# Patient Record
Sex: Male | Born: 1955 | Race: Black or African American | Hispanic: No | Marital: Married | Smoking: Never smoker
Health system: Southern US, Community
[De-identification: ages and names within clinical notes are randomized; demographics above are authoritative.]

## PROBLEM LIST (undated history)

## (undated) DIAGNOSIS — I10 Essential (primary) hypertension: Secondary | ICD-10-CM

## (undated) DIAGNOSIS — E119 Type 2 diabetes mellitus without complications: Secondary | ICD-10-CM

---

## 2000-02-14 ENCOUNTER — Emergency Department (HOSPITAL_COMMUNITY): Admission: EM | Admit: 2000-02-14 | Discharge: 2000-02-14 | Payer: Self-pay | Admitting: Emergency Medicine

## 2000-04-03 ENCOUNTER — Encounter: Admission: RE | Admit: 2000-04-03 | Discharge: 2000-04-03 | Payer: Self-pay | Admitting: Family Medicine

## 2000-04-03 ENCOUNTER — Encounter: Payer: Self-pay | Admitting: Family Medicine

## 2004-09-05 ENCOUNTER — Emergency Department (HOSPITAL_COMMUNITY): Admission: EM | Admit: 2004-09-05 | Discharge: 2004-09-05 | Payer: Self-pay | Admitting: Emergency Medicine

## 2004-09-16 ENCOUNTER — Ambulatory Visit (HOSPITAL_COMMUNITY): Admission: RE | Admit: 2004-09-16 | Discharge: 2004-09-16 | Payer: Self-pay | Admitting: Family Medicine

## 2008-03-27 ENCOUNTER — Emergency Department (HOSPITAL_COMMUNITY): Admission: EM | Admit: 2008-03-27 | Discharge: 2008-03-27 | Payer: Self-pay | Admitting: Emergency Medicine

## 2010-08-30 ENCOUNTER — Emergency Department (HOSPITAL_BASED_OUTPATIENT_CLINIC_OR_DEPARTMENT_OTHER)
Admission: EM | Admit: 2010-08-30 | Discharge: 2010-08-31 | Disposition: A | Discharge status: Nursing Home (Includes ICF) | Payer: BLUE CROSS/BLUE SHIELD | Source: Home / Self Care | Attending: Emergency Medicine | Admitting: Emergency Medicine

## 2010-08-30 DIAGNOSIS — X58XXXA Exposure to other specified factors, initial encounter: Secondary | ICD-10-CM | POA: Insufficient documentation

## 2010-08-30 DIAGNOSIS — I10 Essential (primary) hypertension: Secondary | ICD-10-CM | POA: Insufficient documentation

## 2010-08-30 DIAGNOSIS — T783XXA Angioneurotic edema, initial encounter: Secondary | ICD-10-CM | POA: Insufficient documentation

## 2010-08-30 DIAGNOSIS — R4701 Aphasia: Secondary | ICD-10-CM | POA: Insufficient documentation

## 2010-08-30 DIAGNOSIS — E1169 Type 2 diabetes mellitus with other specified complication: Secondary | ICD-10-CM | POA: Insufficient documentation

## 2010-08-30 DIAGNOSIS — N289 Disorder of kidney and ureter, unspecified: Secondary | ICD-10-CM | POA: Insufficient documentation

## 2010-08-30 DIAGNOSIS — R220 Localized swelling, mass and lump, head: Secondary | ICD-10-CM | POA: Insufficient documentation

## 2010-08-30 LAB — CBC
HCT: 37.7 % — ABNORMAL LOW (ref 39.0–52.0)
Hemoglobin: 12.6 g/dL — ABNORMAL LOW (ref 13.0–17.0)
MCH: 24.9 pg — ABNORMAL LOW (ref 26.0–34.0)
MCHC: 33.4 g/dL (ref 30.0–36.0)
MCV: 74.5 fL — ABNORMAL LOW (ref 78.0–100.0)
Platelets: 283 10*3/uL (ref 150–400)
RBC: 5.06 MIL/uL (ref 4.22–5.81)
RDW: 15.1 % (ref 11.5–15.5)
WBC: 7.7 10*3/uL (ref 4.0–10.5)

## 2010-08-30 LAB — BASIC METABOLIC PANEL
BUN: 25 mg/dL — ABNORMAL HIGH (ref 6–23)
CO2: 26 mEq/L (ref 19–32)
Calcium: 9.4 mg/dL (ref 8.4–10.5)
Chloride: 99 mEq/L (ref 96–112)
Creatinine, Ser: 1.8 mg/dL — ABNORMAL HIGH (ref 0.4–1.5)
GFR calc Af Amer: 48 mL/min — ABNORMAL LOW (ref >60)
GFR calc non Af Amer: 40 mL/min — ABNORMAL LOW (ref >60)
Glucose, Bld: 173 mg/dL — ABNORMAL HIGH (ref 70–99)
Potassium: 4.3 mEq/L (ref 3.5–5.1)
Sodium: 139 mEq/L (ref 135–145)

## 2010-08-31 ENCOUNTER — Inpatient Hospital Stay (HOSPITAL_COMMUNITY)
Admission: EM | Admit: 2010-08-31 | Discharge: 2010-09-02 | DRG: 447 | Disposition: A | Discharge status: Home / Self Care | Payer: BLUE CROSS/BLUE SHIELD | Source: Other Acute Inpatient Hospital | Attending: Internal Medicine | Admitting: Internal Medicine

## 2010-08-31 DIAGNOSIS — N19 Unspecified kidney failure: Secondary | ICD-10-CM | POA: Diagnosis present

## 2010-08-31 DIAGNOSIS — T44905A Adverse effect of unspecified drugs primarily affecting the autonomic nervous system, initial encounter: Secondary | ICD-10-CM | POA: Diagnosis present

## 2010-08-31 DIAGNOSIS — T783XXA Angioneurotic edema, initial encounter: Principal | ICD-10-CM | POA: Diagnosis present

## 2010-08-31 DIAGNOSIS — E119 Type 2 diabetes mellitus without complications: Secondary | ICD-10-CM | POA: Diagnosis present

## 2010-08-31 DIAGNOSIS — I129 Hypertensive chronic kidney disease with stage 1 through stage 4 chronic kidney disease, or unspecified chronic kidney disease: Secondary | ICD-10-CM | POA: Diagnosis present

## 2010-08-31 DIAGNOSIS — Z794 Long term (current) use of insulin: Secondary | ICD-10-CM

## 2010-08-31 DIAGNOSIS — N189 Chronic kidney disease, unspecified: Secondary | ICD-10-CM | POA: Diagnosis present

## 2010-08-31 DIAGNOSIS — X58XXXA Exposure to other specified factors, initial encounter: Secondary | ICD-10-CM | POA: Diagnosis present

## 2010-08-31 LAB — CBC
HCT: 37.9 % — ABNORMAL LOW (ref 39.0–52.0)
Hemoglobin: 12.3 g/dL — ABNORMAL LOW (ref 13.0–17.0)
MCV: 76.4 fL — ABNORMAL LOW (ref 78.0–100.0)
RBC: 4.96 MIL/uL (ref 4.22–5.81)
WBC: 8.9 10*3/uL (ref 4.0–10.5)

## 2010-08-31 LAB — COMPREHENSIVE METABOLIC PANEL
ALT: 24 U/L (ref 0–53)
Alkaline Phosphatase: 63 U/L (ref 39–117)
BUN: 22 mg/dL (ref 6–23)
CO2: 23 mEq/L (ref 19–32)
Chloride: 103 mEq/L (ref 96–112)
Glucose, Bld: 294 mg/dL — ABNORMAL HIGH (ref 70–99)
Potassium: 4.5 mEq/L (ref 3.5–5.1)
Sodium: 136 mEq/L (ref 135–145)
Total Bilirubin: 1 mg/dL (ref 0.3–1.2)
Total Protein: 7.4 g/dL (ref 6.0–8.3)

## 2010-08-31 LAB — MAGNESIUM: Magnesium: 1.9 mg/dL (ref 1.5–2.5)

## 2010-08-31 LAB — GLUCOSE, CAPILLARY
Glucose-Capillary: 311 mg/dL — ABNORMAL HIGH (ref 70–99)
Glucose-Capillary: 280 mg/dL — ABNORMAL HIGH (ref 70–99)
Glucose-Capillary: 272 mg/dL — ABNORMAL HIGH (ref 70–99)
Glucose-Capillary: 272 mg/dL — ABNORMAL HIGH (ref 70–99)

## 2010-09-01 LAB — GLUCOSE, CAPILLARY
Glucose-Capillary: 247 mg/dL — ABNORMAL HIGH (ref 70–99)
Glucose-Capillary: 253 mg/dL — ABNORMAL HIGH (ref 70–99)
Glucose-Capillary: 339 mg/dL — ABNORMAL HIGH (ref 70–99)
Glucose-Capillary: 346 mg/dL — ABNORMAL HIGH (ref 70–99)

## 2010-09-02 LAB — COMPREHENSIVE METABOLIC PANEL
Albumin: 3.5 g/dL (ref 3.5–5.2)
BUN: 40 mg/dL — ABNORMAL HIGH (ref 6–23)
Calcium: 9 mg/dL (ref 8.4–10.5)
Glucose, Bld: 183 mg/dL — ABNORMAL HIGH (ref 70–99)
Sodium: 135 mEq/L (ref 135–145)
Total Protein: 7.1 g/dL (ref 6.0–8.3)

## 2010-09-02 LAB — GLUCOSE, CAPILLARY
Glucose-Capillary: 174 mg/dL — ABNORMAL HIGH (ref 70–99)
Glucose-Capillary: 183 mg/dL — ABNORMAL HIGH (ref 70–99)

## 2010-09-04 NOTE — Discharge Summary (Signed)
[name], [name] NO.:  [id number]  MEDICAL RECORD NO.:  [id number]           PATIENT TYPE:  I  LOCATION:  5526                         FACILITY:  [facility]  PHYSICIAN:  Pincus Large, MD     DATE OF BIRTH:  1955-09-20  DATE OF ADMISSION:  08/31/2010 DATE OF DISCHARGE:  09/02/2010                              DISCHARGE SUMMARY   PRIMARY CARE PHYSICIAN:  Lawrence Aschoff, MD.,  [facility]ician.  REASON FOR ADMISSION:  Difficulty breathing.  DISCHARGE DIAGNOSES: 1. Angioedema secondary to ACE inhibitor. 2. Hypertension. 3. Diabetes type 2. 4. History of gout. 5. Morbid obesity.  MEDICATION AT DISCHARGE:  Demerol 1 mg daily, Medrol Pak, Actos 30 mg daily, labetalol 200 mg p.o. b.i.d., hydrochlorothiazide 25 mg daily, insulin 70/30 10 units b.i.d.  BRIEF HOSPITAL COURSE:  A 55 year old male with known history of diabetes type 2, hypertension, gout, suddenly became short of breath, difficulty breathing, and he felt that throat is being constricted, had been around for, progressively got worse by March 6, went to the ER, was found to have uvula swollen, and swelling of his palate.  The patient was given Solu-Medrol along with some Benadryl and Pepcid. Subsequently, the patient's symptoms improved and he had some mild swelling and his tightness was decreased significant per the patient. The patient was admitted for angioedema.  No images was done.  CONSULT:  None.  LABS:  When he came to the hospital, his WBC was 7.7, hemoglobin was 12.6, platelet was 263.  His creatinine is 1.8.  MRSA by PCR was negative.  The patient was admitted.  He received IV Solu-Medrol which tapered down to prednisone.  We had difficulty controlling his blood sugar, so we had to increase his insulin at night with Lantus.  VITALS ON DISCHARGE:  Temperature is 97.2, pulse is 93, respirations 18, blood pressure is 129/69.  HOSPITAL COURSE: 1. Angioedema secondary to ACE.  ACE was  placed and was patient had     allergy.  The patient was given steroid, so I am taper down his     steroids, send him with tapering dose of steroid. 2. Hypertension.  Since the patient is not going to be on any ACE     inhibitor anymore, I started the patient on 200 mg labetalol     b.i.d., blood pressure is slightly controlled.  The patient was     advised to do ambulatory blood pressure monitor and show it to his     primary care next time he sees his primary care, Dr. Miguel Mooney. 3. Diabetes type 2.  The patient's blood sugar was slightly high     secondary to steroid.  He takes Actos and metformin.  Metformin is     contraindicated in this patient secondary to his renal failure, so     I will hold the metformin for now.  I am going to him Amaryl 1 mg     for now for further management.  Amaryl to be switched to glipizide     according to his primary care. 4. Chronic kidney disease.  According  to the patient, his baseline     creatinine is 1.7.  Today lab shows his creatinine is 2.  He does     not have any symptoms.  He wants to go home, so recommended repeat     this CMET in 1 week and the next time he sees his primary care. 5. Morbid obesity.  Weight loss is recommended in this patient for     better control of his diabetes and his blood sugar.  DISPOSITION:  Home.  DIET:  1800 ADA diet.          ______________________________ Pincus Large, MD     SA/MEDQ  D:  09/02/2010  T:  09/03/2010  Job:  [id number]  Electronically Signed by Pincus Large MD on 09/04/2010 03:53:43 PM

## 2010-09-17 NOTE — H&P (Signed)
NAMEJAYLEND, Mooney NO.:  [id number]  MEDICAL RECORD NO.:  [id number]           PATIENT TYPE:  I  LOCATION:  2111                         FACILITY:  [facility]  PHYSICIAN:  [name], MDDATE OF BIRTH:  12-04-1955  DATE OF ADMISSION:  08/31/2010 DATE OF DISCHARGE:                             HISTORY & PHYSICAL   PRIMARY CARE PHYSICIAN:  C. Duane Lope, MD  CHIEF COMPLAINT:  Difficulty breathing.  HISTORY OF PRESENT ILLNESS:  A 55 year old male with known history of diabetes mellitus type 2, hypertension, and gout suddenly had shortness of breath, difficulty breathing and feeling of his throat being constricted.  It happened around 4, progressively got worsened by 6, and he went to the ER where he was found to have uvula swollen and also swelling of his palate.  The patient was given Solu-Medrol along with some Benadryl and Pepcid.  Subsequently, his symptoms have improved at this time.  He still has mild swelling but his tightness in his neck has decreased significantly as per the patient.  He is able to breathe normally.  Denies any skin rash or itching.  Denies any skin breakout. Denies any cough or phlegm.  Denies any chest pain, nausea, vomiting, abdominal pain, dysuria, discharge, or diarrhea.  The patient denies having similar symptoms previously.  The patient stated he has been using a new medication over-the-counter which is called HCG which is a homeopathic medication used for weight loss and also the patient is on lisinopril for many years now, almost 3 years.  Dose was just increased last year.  At this time, the patient is admitted for further observation for possible angioedema and also at the same time, we will empirically treat him with antibiotics.  PAST MEDICAL HISTORY: 1. Hypertension. 2. Diabetes mellitus type 2. 3. Renal failure. 4. Gout.  PAST SURGICAL HISTORY:  Appendectomy.  MEDICATIONS PRIOR TO ADMISSION:  Lisinopril/HCTZ  20/12.5 p.o. daily, allopurinol, Actos with metformin, furosemide, and a recently started using over-the-counter HCG.  ALLERGIES:  The patient has known drug allergy for PENICILLIN which causes him tachycardic.  FAMILY HISTORY:  Negative for any angioedema and is positive for coronary artery disease and CHF.  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  Denies smoking cigarettes, drinking alcohol, use illegal drugs.  REVIEW OF SYSTEMS:  As per history of present illness nothing else significant.  PHYSICAL EXAMINATION:  GENERAL:  The patient examined at bedside not in acute distress. VITAL SIGNS:  Blood pressure is 208/95, pulse is 102 per minute, temperature 99.4, respirations 20 per minute, and O2 saturation is 97%. HEENT:  It is very difficult to completely see his soft palate and his uvula.  It does look edematous but not erythematous.  Otherwise, his tongue is looking normal and is in midline.  There is no tenderness in the neck or there is no swelling in the lips.  There is no other skin break in the face.  There is no facial asymmetry.  PERRLA positive, anicteric.  No pallor.  No discharge from ears, eyes,  or mouth. CHEST:  Bilateral air entry present.  No rhonchi.  No crepitation.  I did not hear ant stridor in the throat. HEART:  S1 and S2 heard. ABDOMEN:  Soft and nontender.  Bowel sounds heard. CNS:  Alert, awake, and oriented to time, place, and person.  Moves upper and lower extremities 5/5. EXTREMITIES:  Peripheral pulses felt, nonpitting edema in the lower extremities.  LABORATORY DATA:  CBC; WBC 7.7, hemoglobin is 12.6, hematocrit 37.7, and platelets 283.  Basic metabolic panel; sodium 139, potassium 4.3, chloride 99, carbon dioxide 26, glucose 173, BUN 25, creatinine 1.8, and calcium 9.4.  ASSESSMENT: 1. Sudden onset of shortness of breath with swelling of his uvula and     palate, most likely angioedema. 2.  Hypertension, uncontrolled. 3. Diabetes mellitus type 2. 4. Renal failure.  Baseline creatinine not known. 5. Morbid obesity. 6. History of gout. 7. Recent use of over-the-counter pill HCG for weight loss.  PLAN: 1. At this time, we will admit the patient to Intensive Care Unit  for     close observation. 2. For his shortness of breath and tightness of his throat, which most     likely from angioedema, most likely cause could be his ACE     inhibitors but the patient also had recently used HCG.  I have     instructed the patient not to re-use both of them.  There is also     possibility of any bacterial pharyngitis which also could cause     swelling of his throat.  For now, the patient has already received     Solu-Medrol and Pepcid and Benadryl which we are going to continue     couple more doses after which once his swelling decreases, we will     start p.o. prednisone tapering dose for next few days.  At this     time, there is also concern for possibility of bacterial     pharyngitis for which we are going to start Avelox 400.  I did     discussed extensively with [name] of Critical Care about the plan. 3. Accelerated hypertension.  At this time, the patient cannot swallow     his pills, until then, we will be placing the patient on IV     hydralazine p.r.n. 4. Diabetes.  At this time, the patient will be on CBG coverage and we     are expecting blood sugar to go high at that time.  We may need     briefly place the patient on IV insulin. 5. The patient did complain of some pain in his throat and that is     little bit concerning, which may be a sign of infection, so we have     already going to place the patient on Avelox for that.  The patient     will be closely followed.     Lawrence Clos, MD     ANK/MEDQ  D:  08/31/2010  T:  08/31/2010  Job:  [id number]  cc:   [name], M.D.  Electronically Signed by Lawrence Minium MD on 09/17/2010 07:54:20 AM

## 2010-12-19 ENCOUNTER — Inpatient Hospital Stay (INDEPENDENT_AMBULATORY_CARE_PROVIDER_SITE_OTHER)
Admission: RE | Admit: 2010-12-19 | Discharge: 2010-12-19 | Disposition: A | Discharge status: Home / Self Care | Payer: BC Managed Care – PPO | Source: Ambulatory Visit | Attending: Family Medicine | Admitting: Family Medicine

## 2010-12-19 DIAGNOSIS — S025XXA Fracture of tooth (traumatic), initial encounter for closed fracture: Secondary | ICD-10-CM

## 2010-12-19 DIAGNOSIS — K089 Disorder of teeth and supporting structures, unspecified: Secondary | ICD-10-CM

## 2011-03-18 LAB — B-NATRIURETIC PEPTIDE (CONVERTED LAB): Pro B Natriuretic peptide (BNP): <30

## 2011-03-18 LAB — CBC
HCT: 40.6
Hemoglobin: 12.9 — ABNORMAL LOW
MCHC: 31.7
MCV: 78.7
RBC: 5.16
WBC: 4.6

## 2011-03-18 LAB — DIFFERENTIAL
Eosinophils Absolute: 0.1
Eosinophils Relative: 2
Lymphs Abs: 1.5
Monocytes Absolute: 0.6
Monocytes Relative: 13 — ABNORMAL HIGH

## 2011-03-18 LAB — HEPATIC FUNCTION PANEL
AST: 20
Albumin: 3.7
Alkaline Phosphatase: 70
Bilirubin, Direct: <0.1
Total Bilirubin: 0.4

## 2011-03-18 LAB — POCT I-STAT, CHEM 8
Creatinine, Ser: 1.8 — ABNORMAL HIGH
Glucose, Bld: 180 — ABNORMAL HIGH
HCT: 42
Hemoglobin: 14.3
Sodium: 137
TCO2: 24

## 2011-03-18 LAB — URINALYSIS, ROUTINE W REFLEX MICROSCOPIC
Bilirubin Urine: NEGATIVE
Leukocytes, UA: NEGATIVE
Nitrite: NEGATIVE
Specific Gravity, Urine: 1.015
Urobilinogen, UA: 0.2
pH: 6

## 2011-03-18 LAB — URINE MICROSCOPIC-ADD ON

## 2016-03-03 ENCOUNTER — Emergency Department (HOSPITAL_BASED_OUTPATIENT_CLINIC_OR_DEPARTMENT_OTHER)
Admission: EM | Admit: 2016-03-03 | Discharge: 2016-03-04 | Disposition: A | Discharge status: Home / Self Care | Payer: BC Managed Care – PPO | Attending: Emergency Medicine | Admitting: Emergency Medicine

## 2016-03-03 ENCOUNTER — Emergency Department (HOSPITAL_BASED_OUTPATIENT_CLINIC_OR_DEPARTMENT_OTHER): Payer: BC Managed Care – PPO

## 2016-03-03 ENCOUNTER — Encounter (HOSPITAL_BASED_OUTPATIENT_CLINIC_OR_DEPARTMENT_OTHER): Payer: Self-pay | Admitting: Emergency Medicine

## 2016-03-03 DIAGNOSIS — E119 Type 2 diabetes mellitus without complications: Secondary | ICD-10-CM | POA: Insufficient documentation

## 2016-03-03 DIAGNOSIS — Z7984 Long term (current) use of oral hypoglycemic drugs: Secondary | ICD-10-CM | POA: Diagnosis not present

## 2016-03-03 DIAGNOSIS — J069 Acute upper respiratory infection, unspecified: Secondary | ICD-10-CM | POA: Insufficient documentation

## 2016-03-03 DIAGNOSIS — I10 Essential (primary) hypertension: Secondary | ICD-10-CM | POA: Diagnosis not present

## 2016-03-03 DIAGNOSIS — M170 Bilateral primary osteoarthritis of knee: Secondary | ICD-10-CM | POA: Insufficient documentation

## 2016-03-03 DIAGNOSIS — R2232 Localized swelling, mass and lump, left upper limb: Secondary | ICD-10-CM | POA: Diagnosis not present

## 2016-03-03 DIAGNOSIS — Z79899 Other long term (current) drug therapy: Secondary | ICD-10-CM | POA: Insufficient documentation

## 2016-03-03 DIAGNOSIS — M25561 Pain in right knee: Secondary | ICD-10-CM | POA: Diagnosis present

## 2016-03-03 HISTORY — DX: Type 2 diabetes mellitus without complications: E11.9

## 2016-03-03 HISTORY — DX: Essential (primary) hypertension: I10

## 2016-03-03 LAB — URINE MICROSCOPIC-ADD ON: Bacteria, UA: NONE SEEN

## 2016-03-03 LAB — BASIC METABOLIC PANEL
Anion gap: 7 (ref 5–15)
BUN: 56 mg/dL — ABNORMAL HIGH (ref 6–20)
CALCIUM: 9.3 mg/dL (ref 8.9–10.3)
CO2: 26 mmol/L (ref 22–32)
CREATININE: 3 mg/dL — AB (ref 0.61–1.24)
Chloride: 105 mmol/L (ref 101–111)
GFR, EST AFRICAN AMERICAN: 25 mL/min — AB (ref >60)
GFR, EST NON AFRICAN AMERICAN: 21 mL/min — AB (ref >60)
GLUCOSE: 74 mg/dL (ref 65–99)
Potassium: 4.7 mmol/L (ref 3.5–5.1)
Sodium: 138 mmol/L (ref 135–145)

## 2016-03-03 LAB — URINALYSIS, ROUTINE W REFLEX MICROSCOPIC
BILIRUBIN URINE: NEGATIVE
Glucose, UA: NEGATIVE mg/dL
Hgb urine dipstick: NEGATIVE
KETONES UR: NEGATIVE mg/dL
LEUKOCYTES UA: NEGATIVE
NITRITE: NEGATIVE
PH: 6.5 (ref 5.0–8.0)
PROTEIN: 30 mg/dL — AB
Specific Gravity, Urine: 1.012 (ref 1.005–1.030)

## 2016-03-03 LAB — CBC WITH DIFFERENTIAL/PLATELET
BASOS ABS: 0 10*3/uL (ref 0.0–0.1)
BASOS PCT: 0 %
EOS ABS: 0.1 10*3/uL (ref 0.0–0.7)
Eosinophils Relative: 0 %
HCT: 33.9 % — ABNORMAL LOW (ref 39.0–52.0)
Hemoglobin: 10.9 g/dL — ABNORMAL LOW (ref 13.0–17.0)
Lymphocytes Relative: 7 %
Lymphs Abs: 1.2 10*3/uL (ref 0.7–4.0)
MCH: 25.3 pg — ABNORMAL LOW (ref 26.0–34.0)
MCHC: 32.2 g/dL (ref 30.0–36.0)
MCV: 78.7 fL (ref 78.0–100.0)
MONO ABS: 1.4 10*3/uL — AB (ref 0.1–1.0)
MONOS PCT: 8 %
NEUTROS PCT: 85 %
Neutro Abs: 14.3 10*3/uL — ABNORMAL HIGH (ref 1.7–7.7)
PLATELETS: 274 10*3/uL (ref 150–400)
RBC: 4.31 MIL/uL (ref 4.22–5.81)
RDW: 17 % — AB (ref 11.5–15.5)
WBC: 17 10*3/uL — ABNORMAL HIGH (ref 4.0–10.5)

## 2016-03-03 LAB — I-STAT CG4 LACTIC ACID, ED: LACTIC ACID, VENOUS: 0.81 mmol/L (ref 0.5–1.9)

## 2016-03-03 MED ORDER — OXYCODONE-ACETAMINOPHEN 5-325 MG PO TABS: 1.0000 | ORAL_TABLET | Freq: Four times a day (QID) | ORAL | 0 refills | Status: AC | PRN

## 2016-03-03 MED ORDER — OXYCODONE HCL 5 MG PO TABS
5.0000 mg | ORAL_TABLET | Freq: Once | ORAL | Status: AC
  Administered 2016-03-03: 5 mg via ORAL
  Filled 2016-03-03: qty 1

## 2016-03-03 MED ORDER — ACETAMINOPHEN 325 MG PO TABS
650.0000 mg | ORAL_TABLET | Freq: Once | ORAL | Status: AC
  Administered 2016-03-03: 650 mg via ORAL
  Filled 2016-03-03: qty 2

## 2016-03-03 NOTE — ED Notes (Signed)
IV access attempted x2 without success.

## 2016-03-03 NOTE — ED Triage Notes (Signed)
Patient reports fever since this morning along with "hacking cough".  States that he injured left knee when falling down stairs a few weeks ago.  Patient also reports abscess to left ring finger x2 weeks.

## 2016-03-03 NOTE — Discharge Instructions (Signed)
Follow up with your primary care provider and [facility] as soon as possible. You likely have a virus that is causing your fever. Your knee x-rays show arthritis in both of your knees which was likely exacerbated by your fall last week. Take the Percocet as needed for pain. It has tylenol in it which will help with your fever. If your pain is not that severe you may take over the counter tylenol instead. Return to the ER for new or worsening symptoms.

## 2016-03-03 NOTE — ED Provider Notes (Signed)
[facility] Provider Note   CSN: [id number] Arrival date & time: 03/03/16  1909 By signing my name below, I, [name], attest that this documentation has been prepared under the direction and in the presence of non-physician practitioner, Noelle Penner, PA-C Electronically Signed: Levon Mooney, Scribe. 03/03/2016. 8:50 PM.   History   Chief Complaint Chief Complaint  Patient presents with  . Fever  . Cough  . Abscess  . Knee Injury   HPI [name] is a 60 y.o. male with hx of DM, gout, elevated kidney function, and HTN who presents to the Emergency Department with multiple complaints. Pt complains of fever onset this morning. He notes associated dry "hacking" cough which began this morning. He denies any sputum production. No alleviating or modifying factors noted. Pt has chronic knee pain and reports constant bilateral knee pain which began last week. He states he "twisted" them after missing a stair last week. He also reports a lesion to left ring finger onset a few weeks ago. He describes the pain as constant and states it is worsened with palpation. He states the lesion is unchanged. It is firm and does not drain. Pt denies any drainage, abdominal pain, nausea, or vomiting.  The history is provided by the patient. No language interpreter was used.   Past Medical History:  Diagnosis Date  . Diabetes mellitus without complication (HCC)   . Hypertension     There are no active problems to display for this patient.   History reviewed. No pertinent surgical history.   Home Medications    Prior to Admission medications   Medication Sig Start Date End Date Taking? Authorizing Provider  allopurinol (ZYLOPRIM) 300 MG tablet Take 300 mg by mouth daily.   Yes Historical Provider, MD  carvedilol (COREG) 12.5 MG tablet Take 12.5 mg by mouth 2 (two) times daily with a meal.   Yes Historical Provider, MD  furosemide (LASIX) 40 MG tablet Take 40 mg by mouth.   Yes  Historical Provider, MD  glimepiride (AMARYL) 2 MG tablet Take 2 mg by mouth 2 (two) times daily.   Yes Historical Provider, MD  NIFEdipine (PROCARDIA XL/ADALAT-CC) 60 MG 24 hr tablet Take 60 mg by mouth daily.   Yes Historical Provider, MD  olmesartan-hydrochlorothiazide (BENICAR HCT) 40-12.5 MG tablet Take 1 tablet by mouth daily.   Yes Historical Provider, MD    Family History History reviewed. No pertinent family history.  Social History Social History  Substance Use Topics  . Smoking status: Never Smoker  . Smokeless tobacco: Never Used  . Alcohol use No     Allergies   Lisinopril-hydrochlorothiazide and Penicillins   Review of Systems Review of Systems  Constitutional: Positive for fever.  Respiratory: Positive for shortness of breath.   Gastrointestinal: Negative for abdominal pain, nausea and vomiting.  Skin: Positive for wound.  All other systems reviewed and are negative.   Physical Exam Updated Vital Signs BP 174/82 (BP Location: Left Arm)   Pulse 100   Temp 101 F (38.3 C) (Oral)   Resp 18   Ht 5\' 11"  (1.803 m)   Wt (!) 380 lb (172.4 kg)   SpO2 95%   BMI 53.00 kg/m   Physical Exam  Constitutional: He is oriented to person, place, and time.  Obese, NAD  HENT:  Right Ear: External ear normal.  Left Ear: External ear normal.  Nose: Nose normal.  Mouth/Throat: Oropharynx is clear and moist. No oropharyngeal exudate.  Eyes: Conjunctivae and  EOM are normal. Pupils are equal, round, and reactive to light.  Neck: Normal range of motion. Neck supple.  Cardiovascular: Normal rate, regular rhythm, normal heart sounds and intact distal pulses.   Pulmonary/Chest: Effort normal and breath sounds normal. No respiratory distress. He has no wheezes. He has no rales.  Abdominal: Soft. Bowel sounds are normal. He exhibits no distension. There is no tenderness. There is no rebound and no guarding.  Musculoskeletal: He exhibits no edema.  Mild diffuse bilateral knee  tenderness. Knees with FROM and ambulatory with steady gait. No skin erythema and no edema. 2+ peripheral pulses throughout  Lymphadenopathy:    He has no cervical adenopathy.  Neurological: He is alert and oriented to person, place, and time. No cranial nerve deficit.  Skin: Skin is warm and dry.  Left right finger with 1.5cm firm fixed tender nodule on dorsum of finger between PIP and DIP. No fluctuance. No overlying skin erythema.   Psychiatric: He has a normal mood and affect.  Nursing note and vitals reviewed.  Vitals:   03/03/16 1930 03/03/16 2038 03/03/16 2256  BP: 174/82  137/80  Pulse: 100 88 83  Resp: 18  20  Temp: 101 F (38.3 C)  98.8 F (37.1 C)  TempSrc: Oral  Oral  SpO2: 95%  95%  Weight: (!) 172.4 kg    Height: 5\' 11"  (1.803 m)       ED Treatments / Results  DIAGNOSTIC STUDIES:  Oxygen Saturation is 95% on RA, normal by my interpretation.   COORDINATION OF CARE:  8:48 PM Discussed treatment plan with pt at bedside and pt agreed to plan.  Labs (all labs ordered are listed, but only abnormal results are displayed) Labs Reviewed  URINALYSIS, ROUTINE W REFLEX MICROSCOPIC (NOT AT [facility]) - Abnormal; Notable for the following:       Result Value   Protein, ur 30 (*)    All other components within normal limits  BASIC METABOLIC PANEL - Abnormal; Notable for the following:    BUN 56 (*)    Creatinine, Ser 3.00 (*)    GFR calc non Af Amer 21 (*)    GFR calc Af Amer 25 (*)    All other components within normal limits  CBC WITH DIFFERENTIAL/PLATELET - Abnormal; Notable for the following:    WBC 17.0 (*)    Hemoglobin 10.9 (*)    HCT 33.9 (*)    MCH 25.3 (*)    RDW 17.0 (*)    Neutro Abs 14.3 (*)    Monocytes Absolute 1.4 (*)    All other components within normal limits  URINE MICROSCOPIC-ADD ON - Abnormal; Notable for the following:    Squamous Epithelial / LPF 0-5 (*)    All other components within normal limits  I-STAT CG4 LACTIC ACID, ED  I-STAT CG4  LACTIC ACID, ED    EKG  EKG Interpretation None       Radiology Dg Chest 2 View  Result Date: 03/03/2016 CLINICAL DATA:  Acute onset of fever and cough.  Initial encounter. EXAM: CHEST  2 VIEW COMPARISON:  Chest radiograph performed 03/27/2008 FINDINGS: The lungs are well-aerated. Mild vascular congestion is noted. There is no evidence of focal opacification, pleural effusion or pneumothorax. The heart is normal in size; the mediastinal contour is within normal limits. No acute osseous abnormalities are seen. IMPRESSION: Mild vascular congestion noted.  Lungs remain grossly clear. Electronically Signed   By: Roanna RaiderJeffery  Chang M.D.   On: 03/03/2016 20:48  Dg Knee Complete 4 Views Left  Result Date: 03/03/2016 CLINICAL DATA:  60 y/o  M; twisted knee last surgery with pain. EXAM: LEFT KNEE - COMPLETE 4+ VIEW COMPARISON:  None. FINDINGS: No acute fracture or dislocation is identified. No joint effusion. Severe medial and moderate lateral femorotibial compartment and severe patellofemoral compartment joint space narrowing. Sclerosis of articular surfaces. Moderate sized tricompartmental periarticular osteophytes. IMPRESSION: Extensive osteoarthrosis of the knee greatest in medial femoral tibial and patellofemoral compartments. No acute bony or articular abnormality is identified. Electronically Signed   By: Mitzi Hansen M.D.   On: 03/03/2016 23:22   Dg Knee Complete 4 Views Right  Result Date: 03/03/2016 CLINICAL DATA:  60 y/o  M; constant knee pain which began last week. EXAM: RIGHT KNEE - COMPLETE 4+ VIEW COMPARISON:  None. FINDINGS: No acute fracture or dislocation is identified. No joint effusion. Severe medial and moderate lateral femorotibial air compartment joint space narrowing and joint space narrowing of the patellofemoral compartment with sclerosis of articular surfaces. Moderate size tricompartmental periarticular osteophytes. IMPRESSION: Extensive osteoarthrosis of the knee  greatest in the medial femorotibial compartment. No acute bony or articular abnormality is identified Electronically Signed   By: Mitzi Hansen M.D.   On: 03/03/2016 23:19    Procedures Procedures (including critical care time)  Medications Ordered in ED Medications  acetaminophen (TYLENOL) tablet 650 mg (650 mg Oral Given 03/03/16 2036)  oxyCODONE (Oxy IR/ROXICODONE) immediate release tablet 5 mg (5 mg Oral Given 03/03/16 2057)     Initial Impression / Assessment and Plan / ED Course  I have reviewed the triage vital signs and the nursing notes.  Pertinent labs & imaging results that were available during my care of the patient were reviewed by me and considered in my medical decision making (see chart for details).  Clinical Course    Pt initially febrile to 101F which resolved after tylenol. Pt does have a leukocytosis of 17. However, no pneumonia on CXR, no UTI. Lesion on finger not consistent with infection/abscess. Doubt septic arthritis of knees given no erythema, warmth, or edema. Pt seen and evaluated by attending Dr. Adela Lank as well. Suspect viral URI causing fever and white count. Encouraged close f/u with PCP and orthopedic doctor. X-rays of knees with extensive arthritis in both knees. Rx for pain medicine given. ER return precautions given.  Final Clinical Impressions(s) / ED Diagnoses   Final diagnoses:  Osteoarthritis of both knees, unspecified osteoarthritis type  URI (upper respiratory infection)  Nodule of finger, left   I personally performed the services described in this documentation, which was scribed in my presence. The recorded information has been reviewed and is accurate.  New Prescriptions Discharge Medication List as of 03/03/2016 11:43 PM    START taking these medications   Details  oxyCODONE-acetaminophen (PERCOCET/ROXICET) 5-325 MG tablet Take 1-2 tablets by mouth every 6 (six) hours as needed for severe pain., Starting Sun 03/03/2016, Print           Carlene Coria, PA-C 03/04/16 0035    Melene Plan, DO 03/06/16 1515

## 2016-03-03 NOTE — ED Notes (Signed)
Pt updated, denies questions or needs, "feel better", [facility]. Pending disposition and re-eval.

## 2016-03-14 ENCOUNTER — Emergency Department (HOSPITAL_BASED_OUTPATIENT_CLINIC_OR_DEPARTMENT_OTHER): Payer: BC Managed Care – PPO

## 2016-03-14 ENCOUNTER — Emergency Department (HOSPITAL_BASED_OUTPATIENT_CLINIC_OR_DEPARTMENT_OTHER)
Admission: EM | Admit: 2016-03-14 | Discharge: 2016-03-17 | Disposition: E | Payer: BC Managed Care – PPO | Attending: Emergency Medicine | Admitting: Emergency Medicine

## 2016-03-14 ENCOUNTER — Encounter (HOSPITAL_BASED_OUTPATIENT_CLINIC_OR_DEPARTMENT_OTHER): Payer: Self-pay | Admitting: *Deleted

## 2016-03-14 DIAGNOSIS — R0602 Shortness of breath: Secondary | ICD-10-CM | POA: Diagnosis present

## 2016-03-14 DIAGNOSIS — I509 Heart failure, unspecified: Secondary | ICD-10-CM | POA: Diagnosis not present

## 2016-03-14 DIAGNOSIS — I110 Hypertensive heart disease with heart failure: Secondary | ICD-10-CM | POA: Diagnosis not present

## 2016-03-14 DIAGNOSIS — R0902 Hypoxemia: Secondary | ICD-10-CM | POA: Diagnosis not present

## 2016-03-14 DIAGNOSIS — Z79899 Other long term (current) drug therapy: Secondary | ICD-10-CM | POA: Insufficient documentation

## 2016-03-14 DIAGNOSIS — T884XXA Failed or difficult intubation, initial encounter: Secondary | ICD-10-CM

## 2016-03-14 DIAGNOSIS — E119 Type 2 diabetes mellitus without complications: Secondary | ICD-10-CM | POA: Diagnosis not present

## 2016-03-14 DIAGNOSIS — J189 Pneumonia, unspecified organism: Secondary | ICD-10-CM | POA: Insufficient documentation

## 2016-03-14 DIAGNOSIS — Z7984 Long term (current) use of oral hypoglycemic drugs: Secondary | ICD-10-CM | POA: Diagnosis not present

## 2016-03-14 HISTORY — DX: Morbid (severe) obesity due to excess calories: E66.01

## 2016-03-14 LAB — COMPREHENSIVE METABOLIC PANEL
ALT: 31 U/L (ref 17–63)
AST: 40 U/L (ref 15–41)
Albumin: 3 g/dL — ABNORMAL LOW (ref 3.5–5.0)
Alkaline Phosphatase: 86 U/L (ref 38–126)
Anion gap: 12 (ref 5–15)
BILIRUBIN TOTAL: 0.7 mg/dL (ref 0.3–1.2)
BUN: 85 mg/dL — AB (ref 6–20)
CALCIUM: 9.2 mg/dL (ref 8.9–10.3)
CO2: 23 mmol/L (ref 22–32)
CREATININE: 4.39 mg/dL — AB (ref 0.61–1.24)
Chloride: 103 mmol/L (ref 101–111)
GFR, EST AFRICAN AMERICAN: 16 mL/min — AB (ref >60)
GFR, EST NON AFRICAN AMERICAN: 14 mL/min — AB (ref >60)
Glucose, Bld: 26 mg/dL — CL (ref 65–99)
Potassium: 4.5 mmol/L (ref 3.5–5.1)
Sodium: 138 mmol/L (ref 135–145)
TOTAL PROTEIN: 8.2 g/dL — AB (ref 6.5–8.1)

## 2016-03-14 LAB — CBC WITH DIFFERENTIAL/PLATELET
BASOS PCT: 0 %
Basophils Absolute: 0 10*3/uL (ref 0.0–0.1)
EOS PCT: 2 %
Eosinophils Absolute: 0.2 10*3/uL (ref 0.0–0.7)
HEMATOCRIT: 30.9 % — AB (ref 39.0–52.0)
Hemoglobin: 10.2 g/dL — ABNORMAL LOW (ref 13.0–17.0)
Lymphocytes Relative: 5 %
Lymphs Abs: 0.6 10*3/uL — ABNORMAL LOW (ref 0.7–4.0)
MCH: 24.8 pg — AB (ref 26.0–34.0)
MCHC: 33 g/dL (ref 30.0–36.0)
MCV: 75.2 fL — AB (ref 78.0–100.0)
MONO ABS: 0.2 10*3/uL (ref 0.1–1.0)
Monocytes Relative: 2 %
NEUTROS ABS: 10.4 10*3/uL — AB (ref 1.7–7.7)
NEUTROS PCT: 91 %
Platelets: 394 10*3/uL (ref 150–400)
RBC: 4.11 MIL/uL — ABNORMAL LOW (ref 4.22–5.81)
RDW: 16.3 % — ABNORMAL HIGH (ref 11.5–15.5)
WBC: 11.4 10*3/uL — AB (ref 4.0–10.5)

## 2016-03-14 LAB — CBG MONITORING, ED
Glucose-Capillary: 85 mg/dL (ref 65–99)
Glucose-Capillary: 105 mg/dL — ABNORMAL HIGH (ref 65–99)

## 2016-03-14 LAB — I-STAT VENOUS BLOOD GAS, ED
BICARBONATE: 23.3 mmol/L (ref 20.0–28.0)
O2 SAT: 96 %
PH VEN: 7.43 (ref 7.250–7.430)
PO2 VEN: 86 mmHg — AB (ref 32.0–45.0)
TCO2: 24 mmol/L (ref 0–100)
pCO2, Ven: 35.8 mmHg — ABNORMAL LOW (ref 44.0–60.0)

## 2016-03-14 LAB — I-STAT CG4 LACTIC ACID, ED: Lactic Acid, Venous: 1.38 mmol/L (ref 0.5–1.9)

## 2016-03-14 LAB — TROPONIN I: TROPONIN I: 0.05 ng/mL — AB (ref <0.03)

## 2016-03-14 LAB — BRAIN NATRIURETIC PEPTIDE: B Natriuretic Peptide: 128.9 pg/mL — ABNORMAL HIGH (ref 0.0–100.0)

## 2016-03-14 MED ORDER — ACETAMINOPHEN 500 MG PO TABS
ORAL_TABLET | ORAL | Status: AC
  Filled 2016-03-14: qty 2

## 2016-03-14 MED ORDER — ACETAMINOPHEN 500 MG PO TABS: 1000.0000 mg | ORAL_TABLET | Freq: Once | ORAL | Status: DC

## 2016-03-14 MED ORDER — DEXTROSE 5 % IV SOLN
2.0000 g | Freq: Once | INTRAVENOUS | Status: AC
  Administered 2016-03-14: 2 g via INTRAVENOUS

## 2016-03-14 MED ORDER — EPINEPHRINE HCL 0.1 MG/ML IJ SOSY
PREFILLED_SYRINGE | INTRAMUSCULAR | Status: AC | PRN
  Administered 2016-03-14 (×3): 1 mg via INTRAVENOUS

## 2016-03-14 MED ORDER — SUCCINYLCHOLINE CHLORIDE 20 MG/ML IJ SOLN
INTRAMUSCULAR | Status: AC
  Filled 2016-03-14: qty 1

## 2016-03-14 MED ORDER — VANCOMYCIN HCL IN DEXTROSE 1-5 GM/200ML-% IV SOLN
INTRAVENOUS | Status: AC
  Administered 2016-03-14: 02:00:00
  Filled 2016-03-14: qty 200

## 2016-03-14 MED ORDER — ETOMIDATE 2 MG/ML IV SOLN
INTRAVENOUS | Status: AC | PRN
  Administered 2016-03-14: 30 mg via INTRAVENOUS

## 2016-03-14 MED ORDER — VANCOMYCIN HCL 10 G IV SOLR
1500.0000 mg | Freq: Once | INTRAVENOUS | Status: AC
  Administered 2016-03-14: 1500 mg via INTRAVENOUS
  Filled 2016-03-14: qty 1500

## 2016-03-14 MED ORDER — DEXTROSE 50 % IV SOLN
INTRAVENOUS | Status: AC
  Filled 2016-03-14: qty 50

## 2016-03-14 MED ORDER — ACETAMINOPHEN 650 MG RE SUPP
975.0000 mg | Freq: Once | RECTAL | Status: DC
  Filled 2016-03-14: qty 1

## 2016-03-14 MED ORDER — SODIUM CHLORIDE 0.9 % IV BOLUS (SEPSIS)
1000.0000 mL | Freq: Once | INTRAVENOUS | Status: AC
  Administered 2016-03-14: 1000 mL via INTRAVENOUS

## 2016-03-14 MED ORDER — ETOMIDATE 2 MG/ML IV SOLN
INTRAVENOUS | Status: AC
  Filled 2016-03-14: qty 10

## 2016-03-14 MED ORDER — DEXTROSE 50 % IV SOLN
INTRAVENOUS | Status: AC
  Administered 2016-03-14: 50 mL
  Filled 2016-03-14: qty 50

## 2016-03-14 MED ORDER — ROCURONIUM BROMIDE 50 MG/5ML IV SOLN
INTRAVENOUS | Status: AC
  Filled 2016-03-14: qty 4

## 2016-03-14 MED ORDER — ROCURONIUM BROMIDE 50 MG/5ML IV SOLN
INTRAVENOUS | Status: AC | PRN
  Administered 2016-03-14: 200 mg via INTRAVENOUS

## 2016-03-14 MED ORDER — DEXTROSE 50 % IV SOLN
INTRAVENOUS | Status: AC | PRN
  Administered 2016-03-14: 50 mL via INTRAVENOUS

## 2016-03-14 MED ORDER — CEFEPIME HCL 2 G IJ SOLR
INTRAMUSCULAR | Status: AC
  Filled 2016-03-14: qty 2

## 2016-03-14 MED ORDER — VANCOMYCIN HCL 500 MG IV SOLR
INTRAVENOUS | Status: AC
  Filled 2016-03-14: qty 500

## 2016-03-14 MED ORDER — ATROPINE SULFATE 1 MG/ML IJ SOLN
INTRAMUSCULAR | Status: AC | PRN
  Administered 2016-03-14: .5 mg via INTRAVENOUS

## 2016-03-14 MED FILL — Medication: Qty: 1 | Status: AC

## 2016-03-17 NOTE — ED Notes (Signed)
[facility]

## 2016-03-17 NOTE — ED Notes (Signed)
7.5 fr ET tube place 22" at the lip + color change per cap pt very tight and difficult to bag

## 2016-03-17 NOTE — ED Triage Notes (Signed)
Pt is here for continued sob and fever.  He also reports neck pain.  Neck supple.  Pt was diagnosed with URI and had a chst x-ray performed which was negative at the time. Pt has marked pedal edema but family stats that this is normal for pt.  Pt had  Ibuprofen last about 4 hours ago.  Mask applied to pt and advised pt and family of isolation.

## 2016-03-17 NOTE — Code Documentation (Signed)
Patient time of death occurred at [id number] on November 18, 2015

## 2016-03-17 NOTE — Code Documentation (Signed)
Family updated as to patient's status. And at bedside 

## 2016-03-17 NOTE — ED Notes (Addendum)
CPR / chest compressions begun HR 25 and no detectable pulse radial or corotide

## 2016-03-17 NOTE — Code Documentation (Signed)
Family at beside. Family given emotional support. 

## 2016-03-17 NOTE — ED Provider Notes (Signed)
[facility] Provider Note   CSN: [id number] Arrival date & time: 04/11/16  0024     History   Chief Complaint Chief Complaint  Patient presents with  . Shortness of Breath  Level 5 Caveat: Acuity of condition  HPI [name] is a 60 y.o. male   The history is provided by the patient and medical records.  Shortness of Breath    60 y.o. M with hx of DM, CHF, and HTN, presenting to the ED for SOB and fever.  Seen here on 03/03/16 for URI type symptoms with negative work up and discharged home with URI.  Has been on supportive care at home.  Saw PCP (Dr. Tenny Craw) a few days later and was started on nasal spray and tested for flu.   Patient then saw PCP again for continued SOB. He was then started on Levaquin and tessalon as well.  Family reports breathing has been very shallow, tired, and sleeping more than normal.  States he appears fatigued just walking across the house.  Has continued to run fever (103F here today).  Had ibuprofen about 3 hours ago for fever.  History obtained from wife  Past Medical History:  Diagnosis Date  . Diabetes mellitus without complication (HCC)   . Hypertension   . Morbid obesity (HCC)     There are no active problems to display for this patient.   History reviewed. No pertinent surgical history.     Home Medications    Prior to Admission medications   Medication Sig Start Date End Date Taking? Authorizing Provider  allopurinol (ZYLOPRIM) 300 MG tablet Take 300 mg by mouth daily.    Historical Provider, MD  carvedilol (COREG) 12.5 MG tablet Take 12.5 mg by mouth 2 (two) times daily with a meal.    Historical Provider, MD  furosemide (LASIX) 40 MG tablet Take 40 mg by mouth.    Historical Provider, MD  glimepiride (AMARYL) 2 MG tablet Take 2 mg by mouth 2 (two) times daily.    Historical Provider, MD  NIFEdipine (PROCARDIA XL/ADALAT-CC) 60 MG 24 hr tablet Take 60 mg by mouth daily.    Historical Provider, MD    olmesartan-hydrochlorothiazide (BENICAR HCT) 40-12.5 MG tablet Take 1 tablet by mouth daily.    Historical Provider, MD  oxyCODONE-acetaminophen (PERCOCET/ROXICET) 5-325 MG tablet Take 1-2 tablets by mouth every 6 (six) hours as needed for severe pain. 03/03/16   Carlene Coria, PA-C    Family History No family history on file.  Social History Social History  Substance Use Topics  . Smoking status: Never Smoker  . Smokeless tobacco: Never Used  . Alcohol use No     Allergies   Lisinopril-hydrochlorothiazide and Penicillins   Review of Systems Review of Systems  Unable to perform ROS: Acuity of condition     Physical Exam Updated Vital Signs BP 120/72 (BP Location: Right Arm)   Pulse 100   Temp (!) 103.2 F (39.6 C) (Oral)   Resp (!) 30   Wt (!) 380 lb (172.4 kg)   SpO2 (!) 88%   BMI 53.00 kg/m   Physical Exam  Constitutional: Vital signs are normal. He appears well-developed and well-nourished.  Non-toxic appearance. He does not appear ill. He appears distressed.  HENT:  Head: Normocephalic and atraumatic.  Nose: Nose normal.  Mouth/Throat: Oropharynx is clear and moist. No oropharyngeal exudate.  Eyes: Conjunctivae and EOM are normal. Pupils are equal, round, and reactive to light. No scleral icterus.  Neck: Normal range of motion. Neck supple. No tracheal deviation, no edema, no erythema and normal range of motion present. No thyroid mass and no thyromegaly present.  Cardiovascular: Normal rate, regular rhythm, S1 normal, S2 normal, normal heart sounds, intact distal pulses and normal pulses.  Exam reveals no gallop and no friction rub.   No murmur heard. Pulmonary/Chest: He is in respiratory distress.  Breathing labored with accessory muscle use, some grunting noted, diminished throughout, tachypnea   Abdominal: Soft. Normal appearance and bowel sounds are normal. He exhibits no distension, no ascites and no mass. There is no hepatosplenomegaly. There is no  tenderness. There is no rebound, no guarding and no CVA tenderness.  Musculoskeletal: Normal range of motion. He exhibits edema. He exhibits no tenderness.  Significant edema at ankles (chronic)  Lymphadenopathy:    He has no cervical adenopathy.  Neurological: He is alert. He has normal strength. No sensory deficit.  Unable to follow any commands  Skin: Skin is warm, dry and intact. No petechiae and no rash noted. He is not diaphoretic. No erythema. No pallor.  Tactile fever  Nursing note and vitals reviewed.    ED Treatments / Results  Labs (all labs ordered are listed, but only abnormal results are displayed) Labs Reviewed  CBC WITH DIFFERENTIAL/PLATELET - Abnormal; Notable for the following:       Result Value   WBC 11.4 (*)    RBC 4.11 (*)    Hemoglobin 10.2 (*)    HCT 30.9 (*)    MCV 75.2 (*)    MCH 24.8 (*)    RDW 16.3 (*)    Neutro Abs 10.4 (*)    Lymphs Abs 0.6 (*)    All other components within normal limits  COMPREHENSIVE METABOLIC PANEL - Abnormal; Notable for the following:    Glucose, Bld 26 (*)    BUN 85 (*)    Creatinine, Ser 4.39 (*)    Total Protein 8.2 (*)    Albumin 3.0 (*)    GFR calc non Af Amer 14 (*)    GFR calc Af Amer 16 (*)    All other components within normal limits  TROPONIN I - Abnormal; Notable for the following:    Troponin I 0.05 (*)    All other components within normal limits  BRAIN NATRIURETIC PEPTIDE - Abnormal; Notable for the following:    B Natriuretic Peptide 128.9 (*)    All other components within normal limits  CULTURE, BLOOD (ROUTINE X 2)  CULTURE, BLOOD (ROUTINE X 2)  URINE CULTURE  URINALYSIS, ROUTINE W REFLEX MICROSCOPIC (NOT AT [facility])  LACTIC ACID, PLASMA  I-STAT CG4 LACTIC ACID, ED    EKG  EKG Interpretation  Date/Time:  Thursday March 14 2016 01:01:00 EDT Ventricular Rate:  96 PR Interval:    QRS Duration: 90 QT Interval:  332 QTC Calculation: 420 R Axis:   15 Text Interpretation:  Sinus rhythm  Abnormal R-wave progression, early transition No significant change since last tracing Confirmed by Erroll LunaOni, Trey Bebee Ayokunle [id number]) on January 27, 2016 1:24:34 AM       Radiology Dg Chest Port 1 View  Result Date: January 27, 2016 CLINICAL DATA:  Endotracheal tube placement during CPR. Initial encounter. EXAM: PORTABLE CHEST 1 VIEW COMPARISON:  Chest radiograph from 03/03/2016 FINDINGS: The patient's endotracheal tube is seen ending overlying the right mainstem bronchus, 2-3 cm below the carina. This should be retracted 6 cm. The patient's enteric tube is seen extending below the diaphragm. There is mild elevation of  the right hemidiaphragm. Vascular congestion is seen. Increased interstitial markings raise concern for pulmonary edema. The cardiomediastinal silhouette is mildly enlarged. No acute osseous abnormalities are seen. IMPRESSION: 1. Endotracheal tube noted ending overlying the right mainstem bronchus, 2-3 cm below the carina. This should be retracted 6 cm. 2. Mild elevation of the right hemidiaphragm. Vascular congestion and mild cardiomegaly. Increased interstitial markings raise concern for pulmonary edema. These results were called by telephone at the time of interpretation on 03-26-16 at 3:29 am to the Charge Nurse at the [facility] ER, who verbally acknowledged these results. Electronically Signed   By: Roanna Raider M.D.   On: 03-26-2016 03:33   Dg Chest Port 1 View  Result Date: Mar 26, 2016 CLINICAL DATA:  Acute onset of severe shortness of breath. Respiratory distress. Initial encounter. EXAM: PORTABLE CHEST 1 VIEW COMPARISON:  Chest radiograph performed earlier today at 1:20 a.m. FINDINGS: The patient's endotracheal tube is noted ending 3-4 cm below the carina, overlying the right mainstem bronchus. The enteric tube is noted extending below the diaphragm. There is mild elevation of the right hemidiaphragm. Vascular crowding and vascular congestion are seen. Increased interstitial markings  may reflect pulmonary edema. No pleural effusion or pneumothorax is seen. The cardiomediastinal silhouette is mildly enlarged. No acute osseous abnormalities are identified. IMPRESSION: 1. Endotracheal tube ends overlying the right mainstem bronchus, 3-4 cm above the carina. This should be retracted 7 cm. 2. Mild elevation of the right hemidiaphragm. Vascular congestion and mild cardiomegaly. Increased interstitial markings may reflect pulmonary edema. These results were called by telephone at the time of interpretation on 03/26/16 at 3:29 am to the Charge Nurse at the [facility] ER. Electronically Signed   By: Roanna Raider M.D.   On: 2016-03-26 03:30    Procedures Procedures (including critical care time)  Medications Ordered in ED Medications  ceFEPIme (MAXIPIME) 2 g injection (not administered)  vancomycin (VANCOCIN) 500 MG powder (not administered)  vancomycin (VANCOCIN) 1-5 GM/200ML-% IVPB (not administered)  acetaminophen (TYLENOL) tablet 1,000 mg (not administered)  acetaminophen (TYLENOL) 500 MG tablet (not administered)  etomidate (AMIDATE) 2 MG/ML injection (not administered)  rocuronium (ZEMURON) 50 MG/5ML injection (not administered)  etomidate (AMIDATE) 2 MG/ML injection (not administered)  dextrose 50 % solution (not administered)  sodium chloride 0.9 % bolus 1,000 mL (1,000 mLs Intravenous New Bag/Given 03/26/2016 0117)  vancomycin (VANCOCIN) 1,500 mg in sodium chloride 0.9 % 500 mL IVPB (1,500 mg Intravenous New Bag/Given 03-26-2016 0155)  ceFEPIme (MAXIPIME) 2 g in dextrose 5 % 50 mL IVPB (2 g Intravenous New Bag/Given March 26, 2016 0126)  dextrose 50 % solution (50 mLs  Given 2016-03-26 0151)  dextrose 50 % solution (50 mLs  Given 2016-03-26 0141)  etomidate (AMIDATE) injection (30 mg Intravenous Given 03-26-2016 0157)  rocuronium (ZEMURON) injection (200 mg Intravenous Given 03/26/2016 0159)  atropine injection (0.5 mg Intravenous Given 03-26-2016 0230)  EPINEPHrine (ADRENALIN) 0.1  MG/ML injection (1 mg Intravenous Given March 26, 2016 0240)  dextrose 50 % solution (50 mLs Intravenous Given 2016-03-26 0241)     Initial Impression / Assessment and Plan / ED Course  I have reviewed the triage vital signs and the nursing notes.  Pertinent labs & imaging results that were available during my care of the patient were reviewed by me and considered in my medical decision making (see chart for details).  Clinical Course   60 y.o. M here with SOB and fever.  Febrile to 103F and hypoxic on arrival.  He appears very  critical on arrival.  Breathing is labored with accessory muscle use noted.  Some grunting.  Still hypoxic on high dose supplemental O2.  Septic work-up initiated upon initial assessment.  Transitioned to bipap ASAP.   Patient continues to deteriorate despite being on BiPAP. Code sepsis has been called and he is getting IV antibiotics currently. No labs are back yet.  He continues to appear more agitated and restless. He is unable to tell me what is wrong with him.Marland Kitchen He is clearly altered and struggling to breath.  I assume his is hypercarbic and but no blood gases come back yet. Decision was made to move him to the resuscitation bay for intubation.  Labs have returned with a blood sugar of 25. He was immediately given 2 Amps of D50. After repeat evaluation, patient still was agitated and altered.  I thought this may clear him up, but his agitation persists.  This is likely respiratory. Repeat BS is 85.  A 3rd amp D50 was given.  After discussion with family, wife reports that his blood sugar was high prior to arrival and she gave him an increased dose of his insulin to get it back down.  His BS levels have been erratic recently.    I attempted to intubate the patient first with direct laryngoscopy without any success. I also tried to pass a bougie without success. I subsequently tried the glide scope, patient had a large tongue and very small vocal cords. I could visualize the cords  but could not pass the tube.Decision was made to bag the patient multiple times in between each attempt and his oxygen rose back to 90-100.  Throughout this resuscitation, patient's oxygen saturation dropped to 40% at the very lowest. Finally I performed a cricothyroidotomy to obtain an airway but could not complete the procedure due to blood obstructing the field.  His thick neck contributed to my poor visualization.  Patient was bagged up again to 100% and I reattempted with glide scope, the intubation was successful this time.  Patient still had a pulse but began to brady down. CPR was started for approximately 15 minutes before he is obtained return of spontaneous circulation. Repeat blood sugar was now 110.  Patient was stable for about 20 minutes with good pulses and normal blood pressure, 120/74. Plan to transfer to [place] and he was accepted by the ICU.  After x-ray revealed right mainstem intubation, the tube was backed out. At this time patient brady down again and we performed CPR for approximately 45 more minutes. Patient was in asystole and PEA the entire time. Family was brought to the room for family centered CPR as they wished to see their family member. Time of death was 2:50 AM.  I spoke to the ME, Al Decant, who did not accept this case.   INTUBATION Performed by: Tomasita Crumble  Required items: required blood products, implants, devices, and special equipment available Patient identity confirmed: provided demographic data and hospital-assigned identification number Time out: Immediately prior to procedure a "time out" was called to verify the correct patient, procedure, equipment, support staff and site/side marked as required.  Indications: Respiratory failure, AMS  Intubation method: Glidescope Laryngoscopy   Preoxygenation: Bipap  Sedatives: 30mg  Etomidate Paralytic: 200mg  rocuronium  Tube Size: 7-5 cuffed  Post-procedure assessment: chest rise and ETCO2  monitor Breath sounds: equal and absent over the epigastrium Tube secured with: ETT holder Chest x-ray interpreted by radiologist and me.  Chest x-ray findings: R mainstem intubation, 7cm deep, RT  advised to adjust tube  Patient tolerated the procedure well with no immediate complications.      Cardiopulmonary Resuscitation (CPR) Procedure Note Directed/Performed by: Tomasita Crumble I personally directed ancillary staff and/or performed CPR in an effort to regain return of spontaneous circulation and to maintain cardiac, neuro and systemic perfusion.    Final Clinical Impressions(s) / ED Diagnoses   Final diagnoses:  Difficult airway for intubation  Difficult airway for intubation, initial encounter    New Prescriptions New Prescriptions   No medications on file     Tomasita Crumble, MD 2016/03/22 [phone]

## 2016-03-17 DEATH — deceased

## 2016-03-19 LAB — CULTURE, BLOOD (ROUTINE X 2)
CULTURE: NO GROWTH
Culture: NO GROWTH

## 2017-06-05 IMAGING — DX DG CHEST 1V PORT
2 series · 2 of 2 positions shown · non-contrast
Comparison: Chest radiograph from 03/03/2016

CLINICAL DATA: Endotracheal tube placement during CPR. Initial
encounter.

EXAM:
PORTABLE CHEST 1 VIEW

[chest ap (1 of 2)]
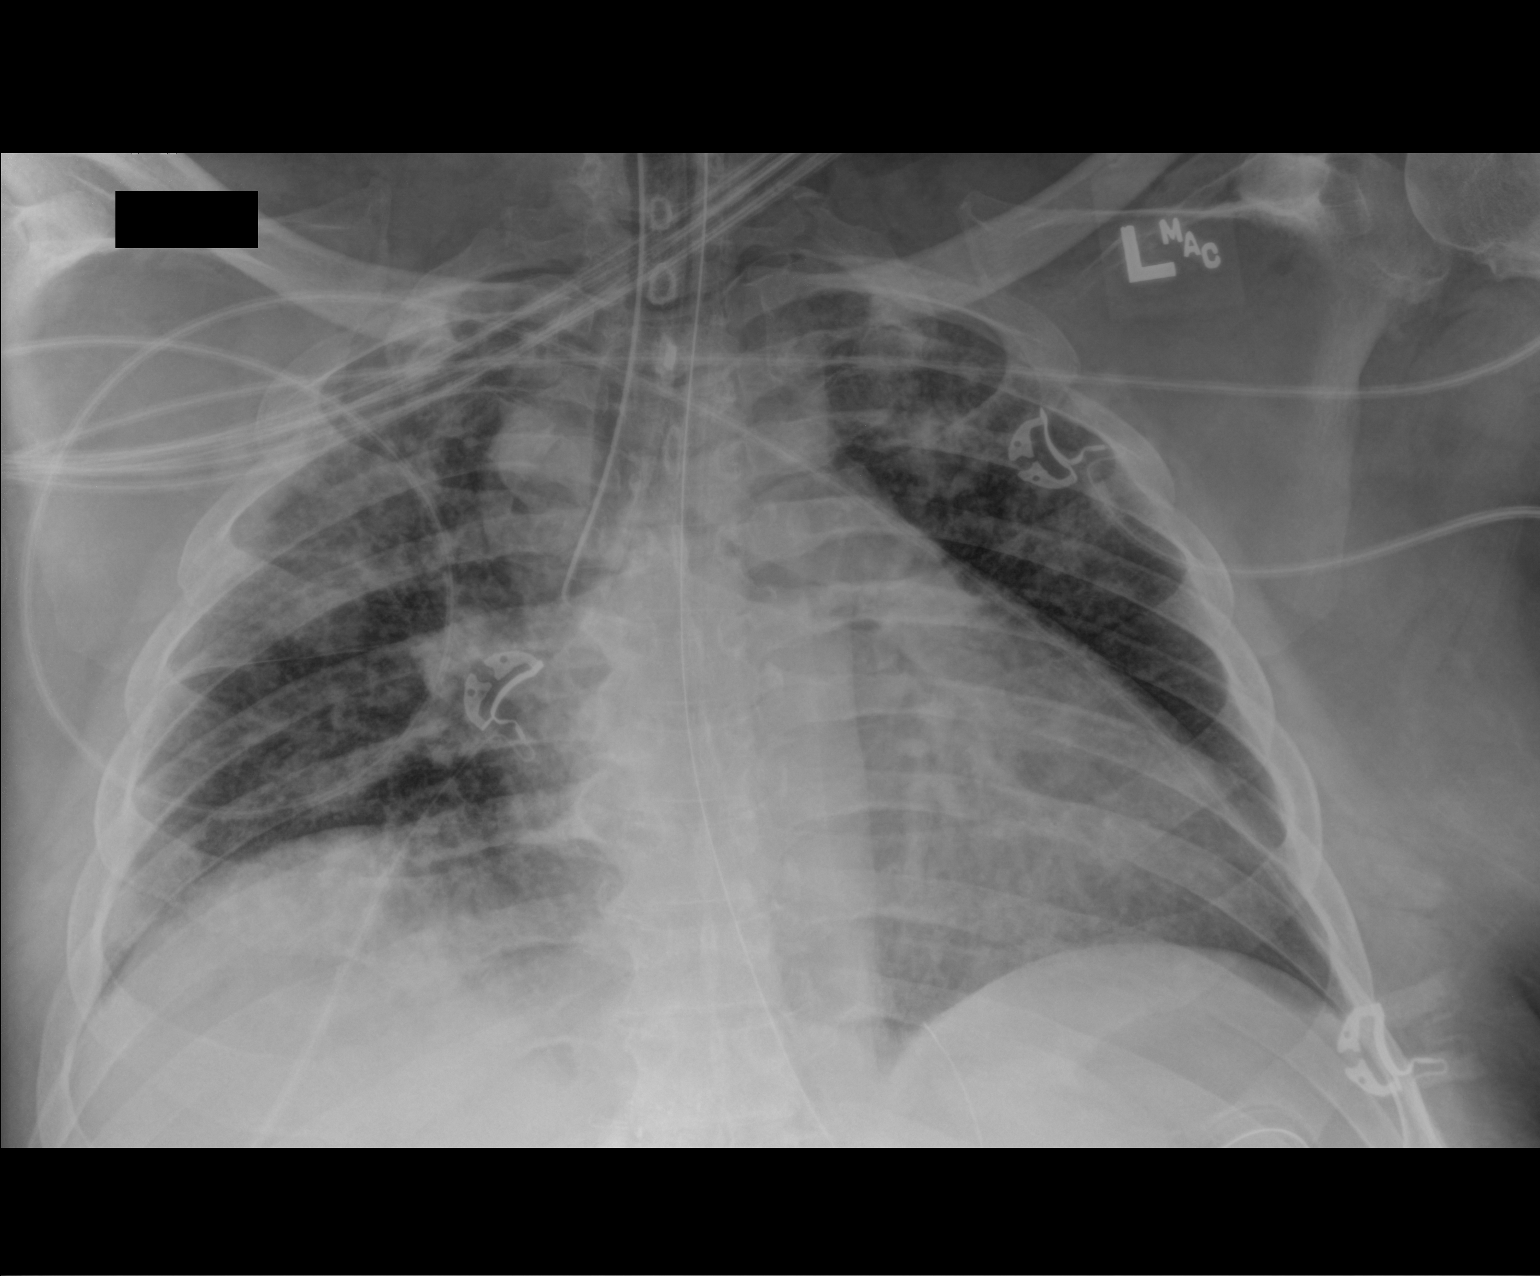

[chest ap (2 of 2)]
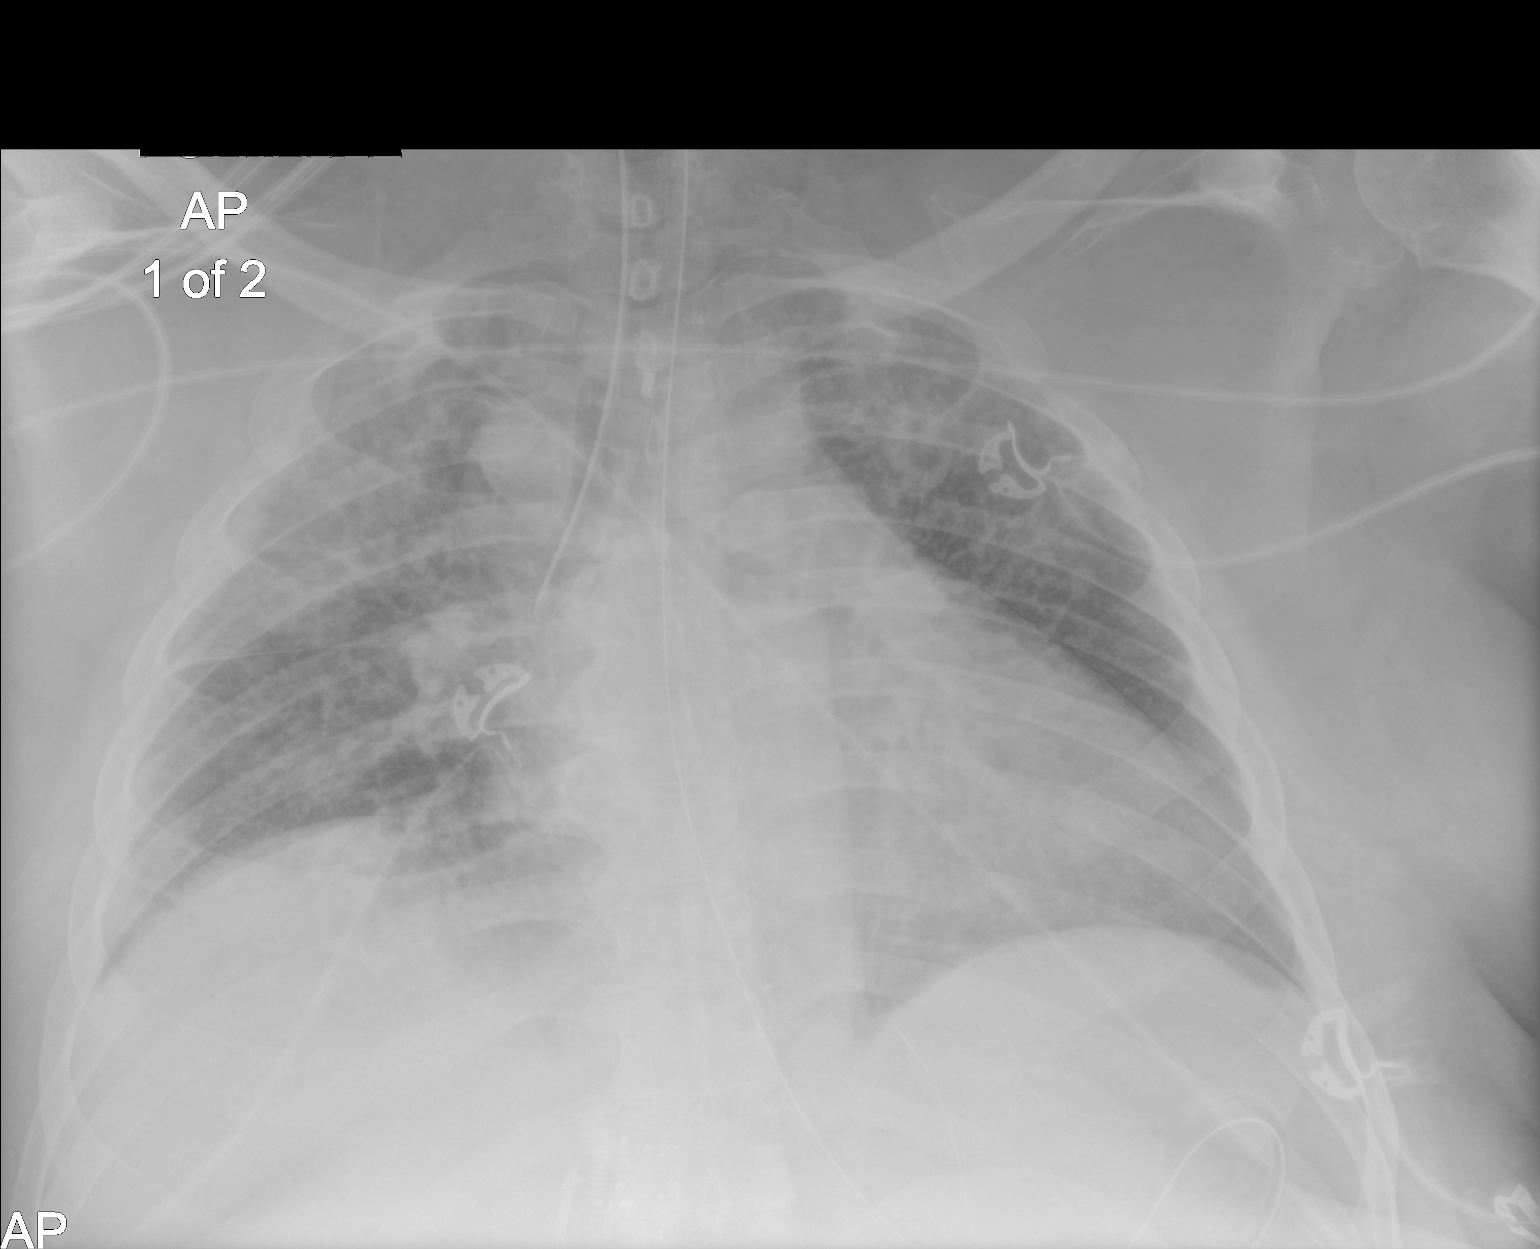

[2 of 2 positions shown; findings below may reference images not displayed]

FINDINGS: The patient's endotracheal tube is seen ending overlying the right
mainstem bronchus, 2-3 cm below the carina. This should be retracted
6 cm.

The patient's enteric tube is seen extending below the diaphragm.

There is mild elevation of the right hemidiaphragm. Vascular
congestion is seen. Increased interstitial markings raise concern
for pulmonary edema.

The cardiomediastinal silhouette is mildly enlarged. No acute
osseous abnormalities are seen.
IMPRESSION: 1. Endotracheal tube noted ending overlying the right mainstem
bronchus, 2-3 cm below the carina. This should be retracted 6 cm.
2. Mild elevation of the right hemidiaphragm. Vascular congestion
and mild cardiomegaly. Increased interstitial markings raise concern
for pulmonary edema.

These results were called by telephone at the time of interpretation
on 03/14/2016 at [DATE] to the Charge Nurse at the [HOSPITAL] [REDACTED] ER, who verbally acknowledged these results.
